# Patient Record
Sex: Female | Born: 1995 | Race: White | Hispanic: No | Marital: Single
Health system: Southern US, Community
[De-identification: ages and names within clinical notes are randomized; demographics above are authoritative.]

## PROBLEM LIST (undated history)

## (undated) DIAGNOSIS — F329 Major depressive disorder, single episode, unspecified: Secondary | ICD-10-CM

## (undated) DIAGNOSIS — K219 Gastro-esophageal reflux disease without esophagitis: Secondary | ICD-10-CM

## (undated) DIAGNOSIS — F419 Anxiety disorder, unspecified: Secondary | ICD-10-CM

## (undated) DIAGNOSIS — I10 Essential (primary) hypertension: Secondary | ICD-10-CM

## (undated) DIAGNOSIS — J020 Streptococcal pharyngitis: Secondary | ICD-10-CM

## (undated) DIAGNOSIS — D649 Anemia, unspecified: Secondary | ICD-10-CM

## (undated) DIAGNOSIS — E349 Endocrine disorder, unspecified: Secondary | ICD-10-CM

## (undated) DIAGNOSIS — F32A Depression, unspecified: Secondary | ICD-10-CM

## (undated) HISTORY — DX: Depression, unspecified: F32.A

## (undated) HISTORY — DX: Endocrine disorder, unspecified: E34.9

## (undated) HISTORY — DX: Anxiety disorder, unspecified: F41.9

## (undated) HISTORY — DX: Major depressive disorder, single episode, unspecified: F32.9

## (undated) HISTORY — DX: Anemia, unspecified: D64.9

---
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
## 2016-01-02 ENCOUNTER — Encounter: Payer: Self-pay | Admitting: Obstetrics & Gynecology

## 2016-03-01 ENCOUNTER — Encounter: Payer: Self-pay | Admitting: Obstetrics & Gynecology

## 2016-03-05 IMAGING — CR DG CHEST 2V
2 series · 2 of 2 positions shown · non-contrast
Comparison: 02/09/2011

CLINICAL DATA: Shortness of breath, cough.

EXAM:
CHEST  2 VIEW

[w chest pa]
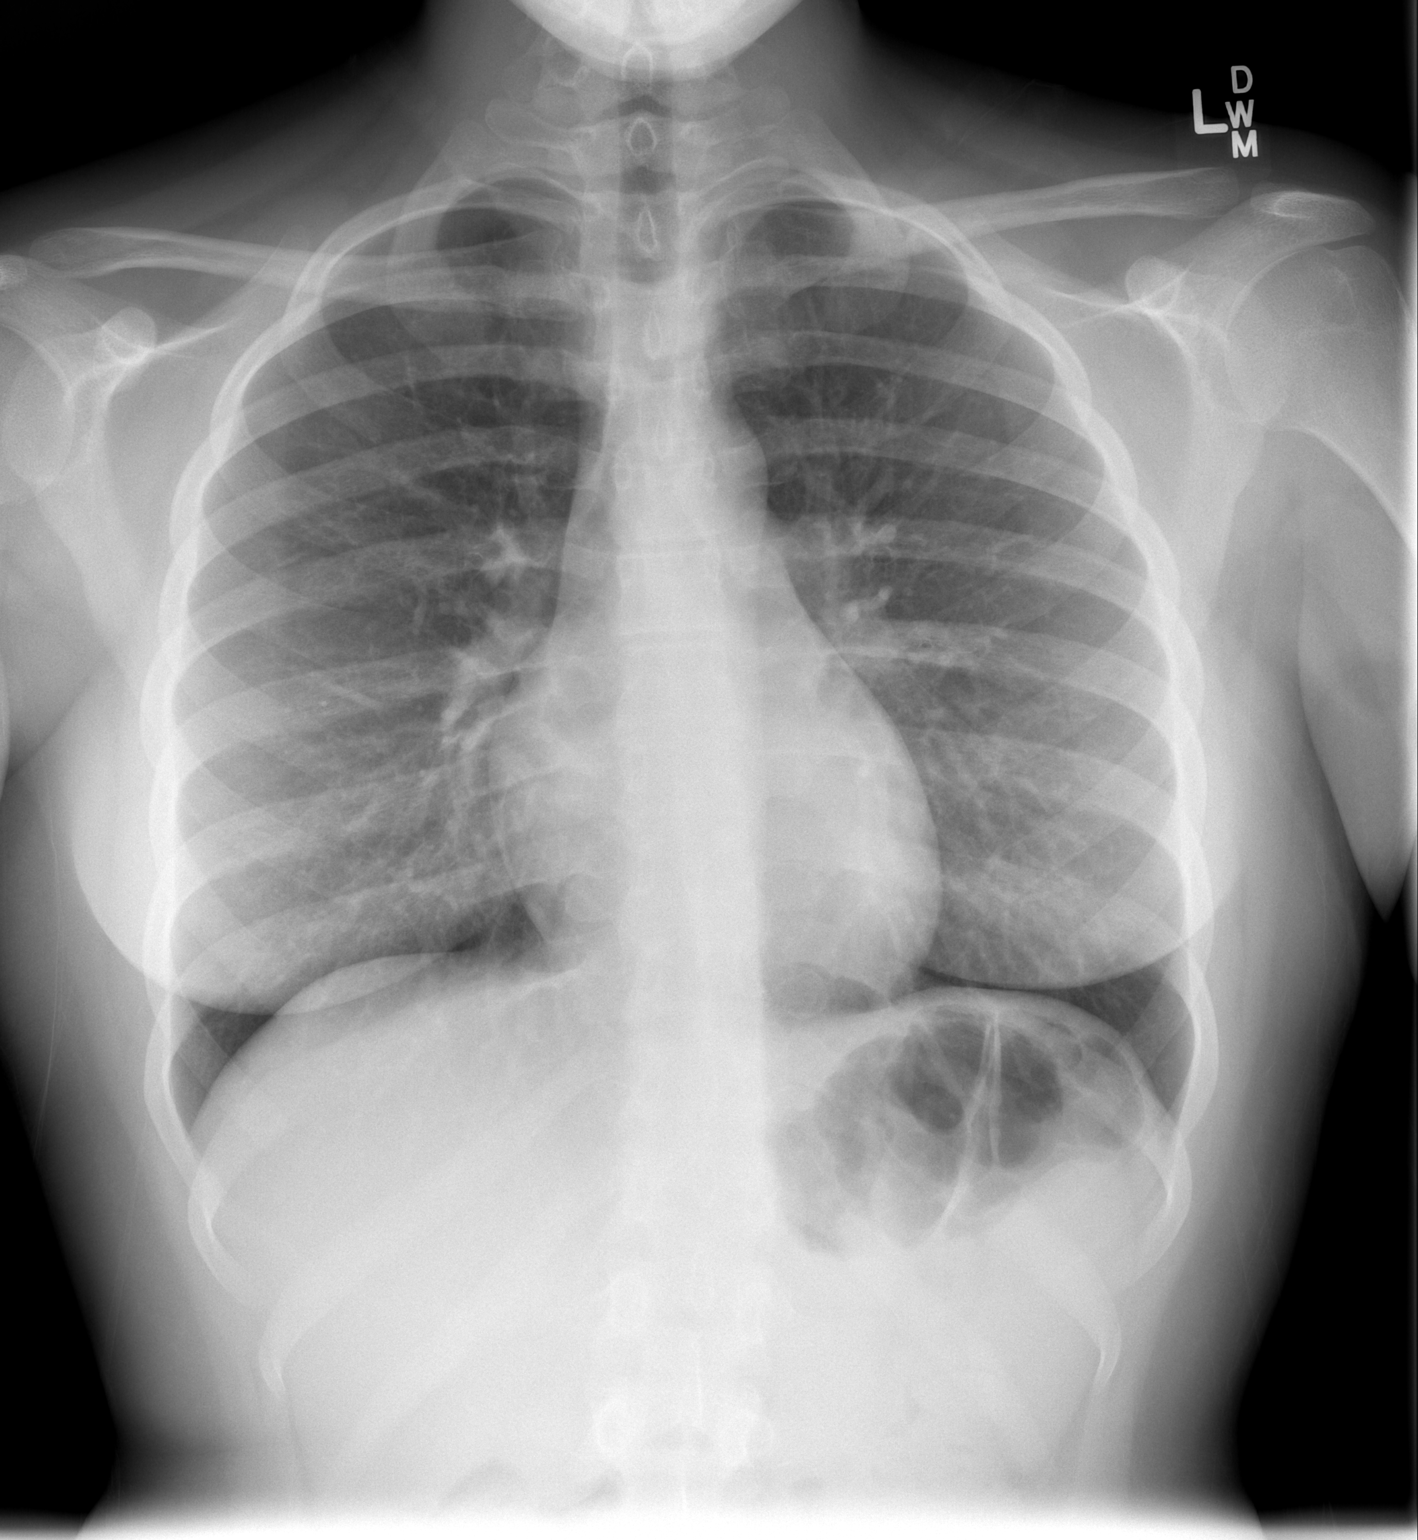

[w chest lat]
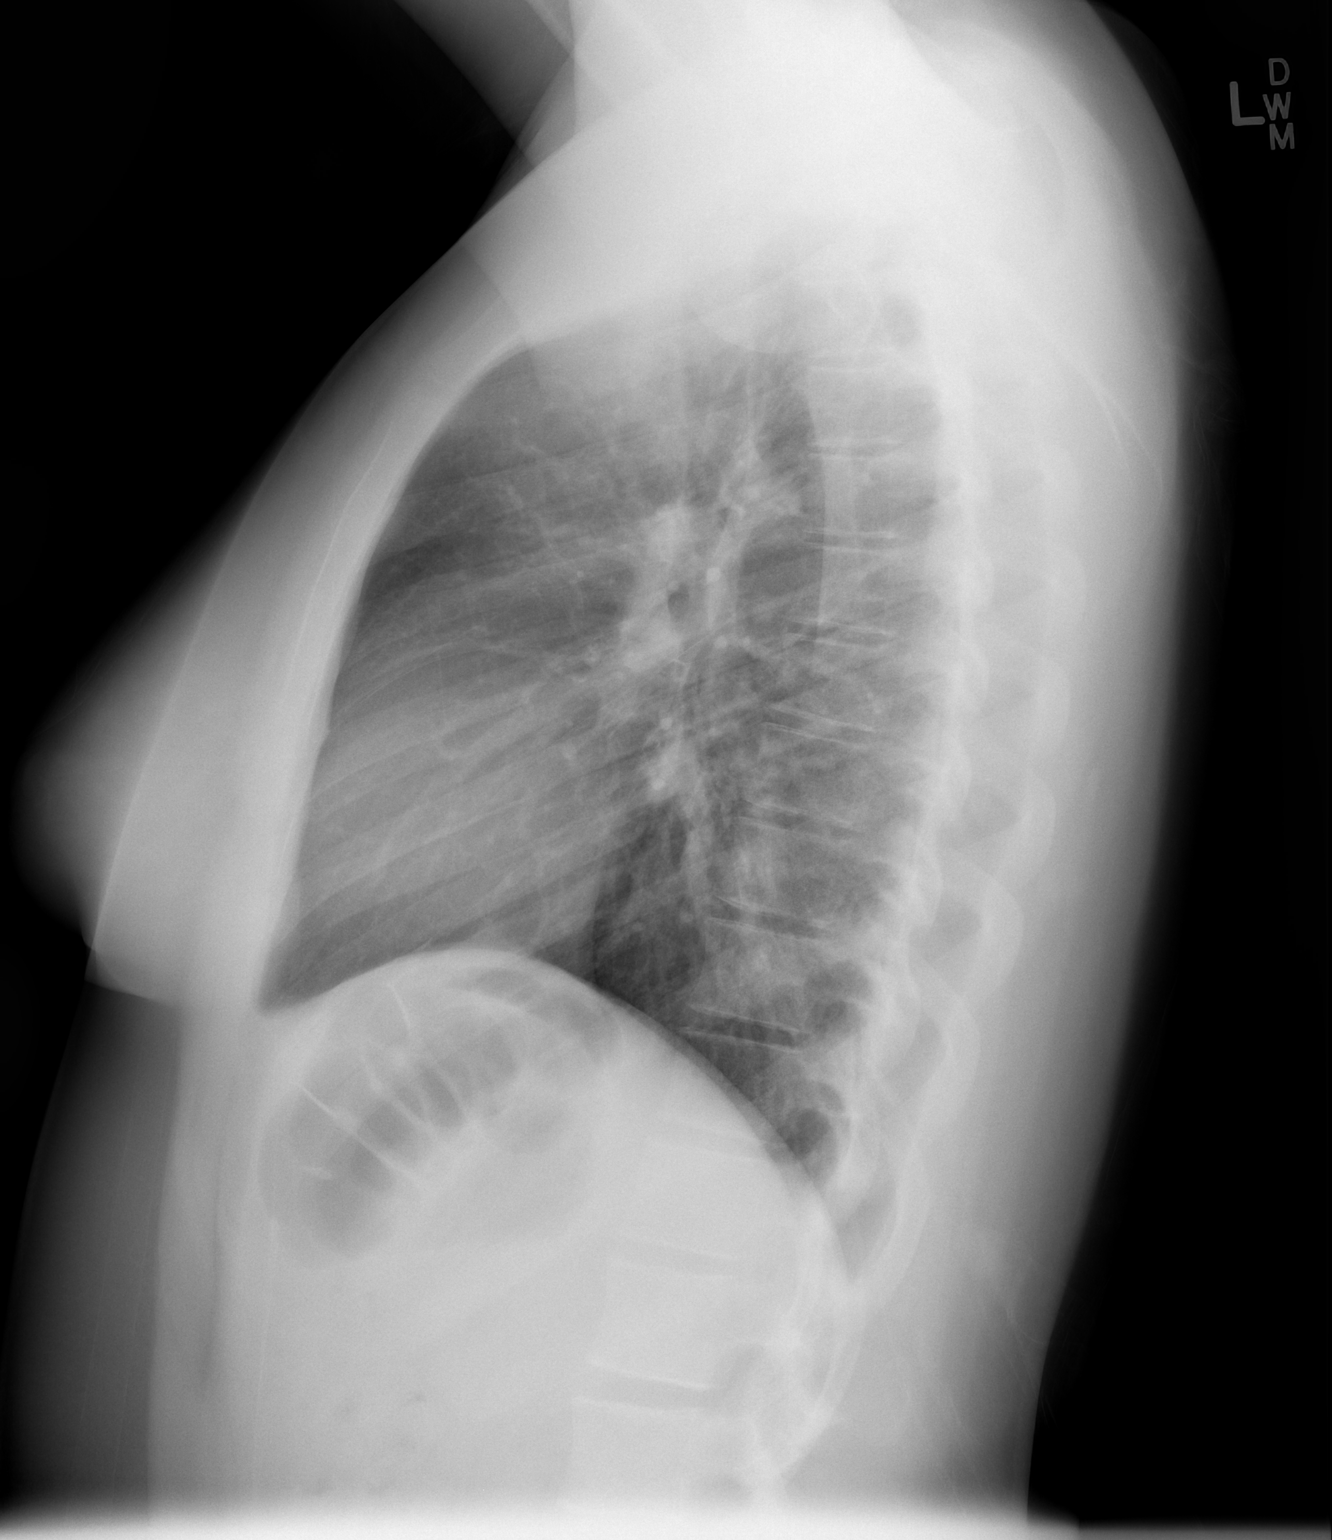

[2 of 2 positions shown; findings below may reference images not displayed]

FINDINGS: The heart size and mediastinal contours are within normal limits.
Both lungs are clear. The visualized skeletal structures are
unremarkable.
IMPRESSION: No active cardiopulmonary disease.

## 2016-04-07 ENCOUNTER — Encounter: Payer: Self-pay | Admitting: Obstetrics & Gynecology

## 2016-08-20 IMAGING — CT CT RENAL STONE PROTOCOL
2 of 4 series · 16 of 46 positions shown, 18 images · non-contrast
Comparison: 10/09/2013 ultrasound.

CLINICAL DATA: Abdominal pain for 3 weeks. LEFT upper quadrant
pain. Pain has migrated from the lower abdomen to the LEFT upper
quadrant. LEFT flank pain.

EXAM:
CT ABDOMEN AND PELVIS WITHOUT CONTRAST
TECHNIQUE: Multidetector CT imaging of the abdomen and pelvis was performed
following the standard protocol without IV contrast.

[Series 2: renal stone < 200 lbs 5.0 b31f · axial · 0.65mm/px · z∈[-480,-75]mm · 13 of 89 slices shown, 15 images]
[im 4/89  soft-tissue]
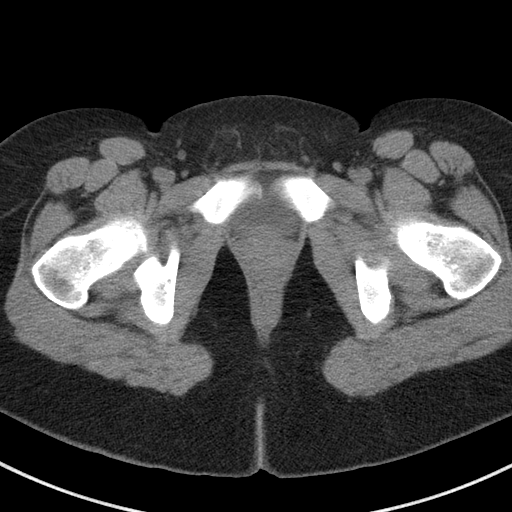
[im 4/89  bone]
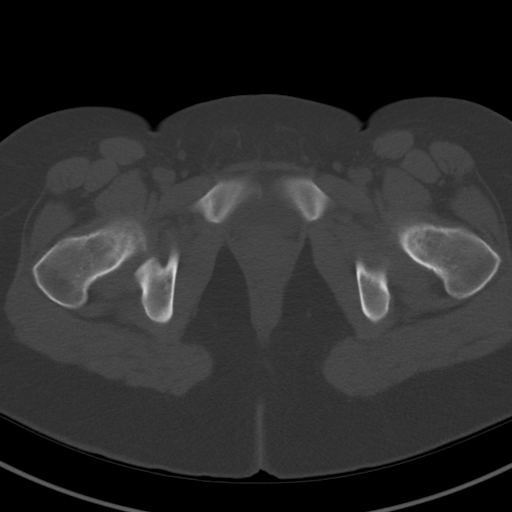
[im 12/89  soft-tissue]
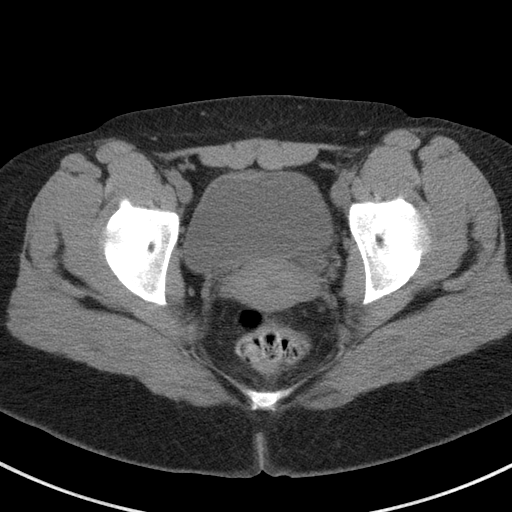
[im 19/89  soft-tissue]
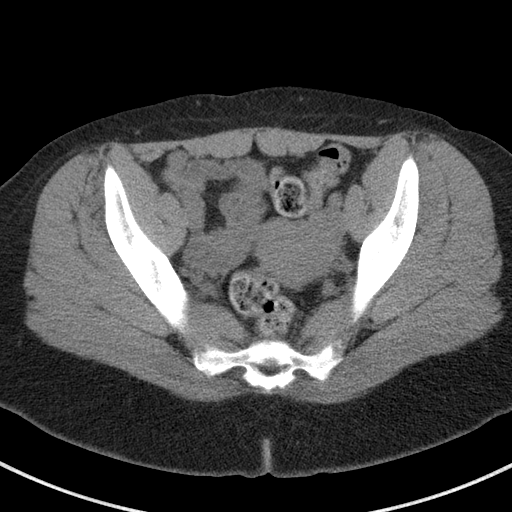
[im 26/89  soft-tissue]
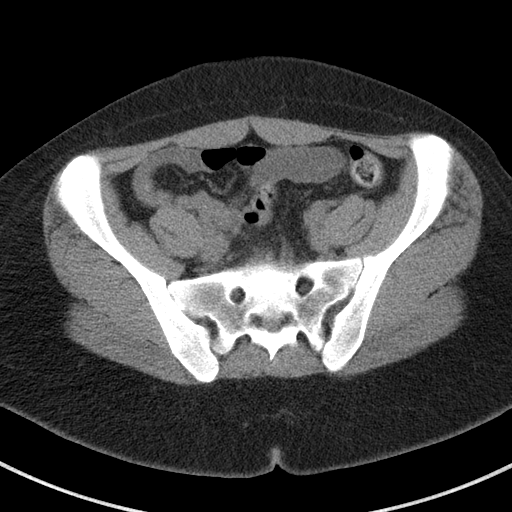
[im 30/89  soft-tissue]
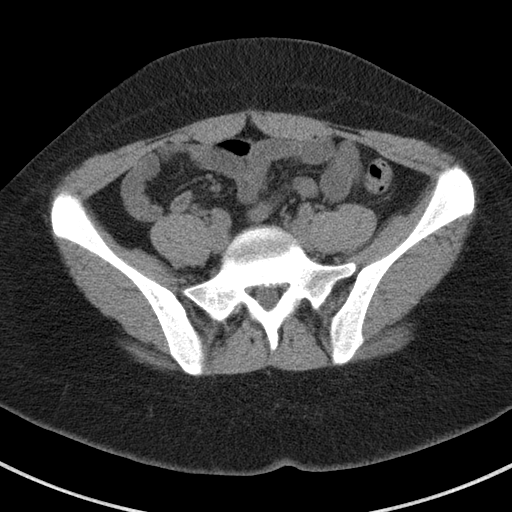
[im 37/89  soft-tissue]
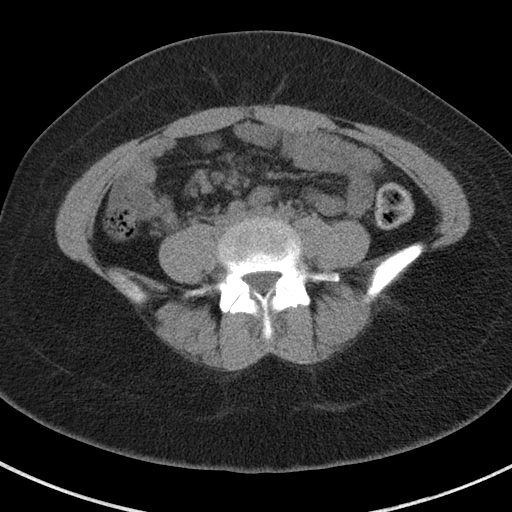
[im 45/89  soft-tissue]
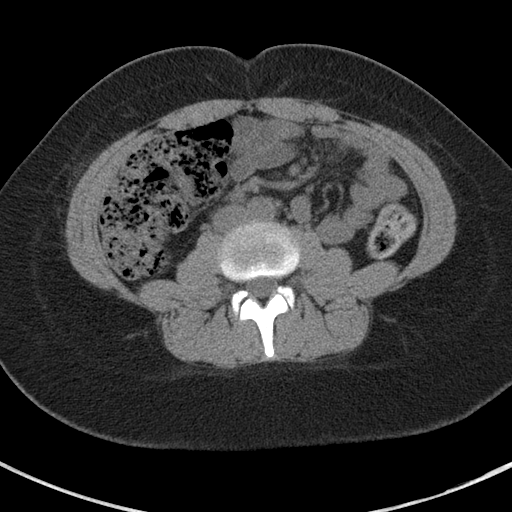
[im 52/89  soft-tissue]
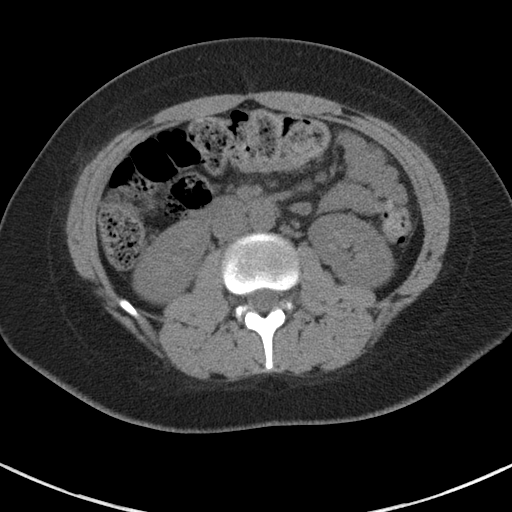
[im 59/89  soft-tissue]
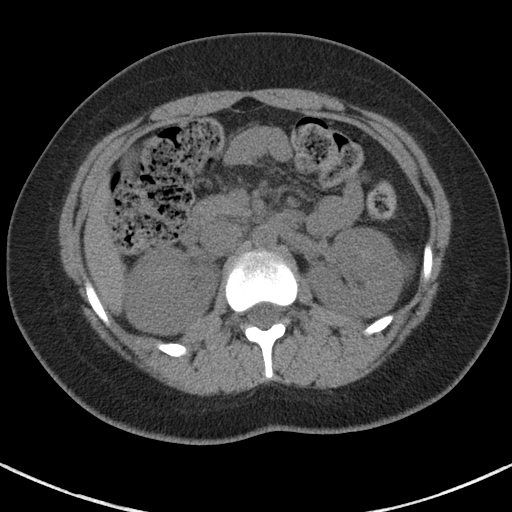
[im 59/89  bone]
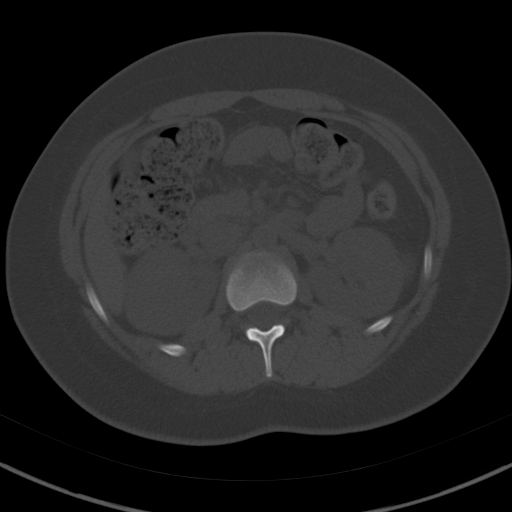
[im 63/89  soft-tissue]
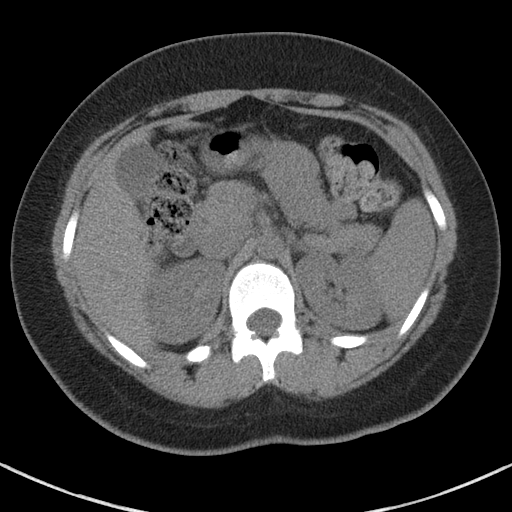
[im 70/89  soft-tissue]
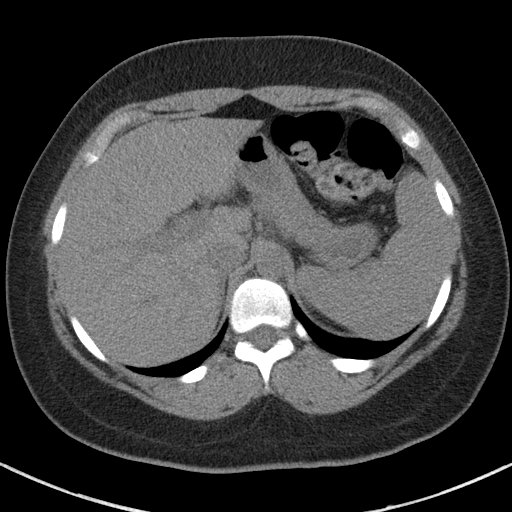
[im 78/89  soft-tissue]
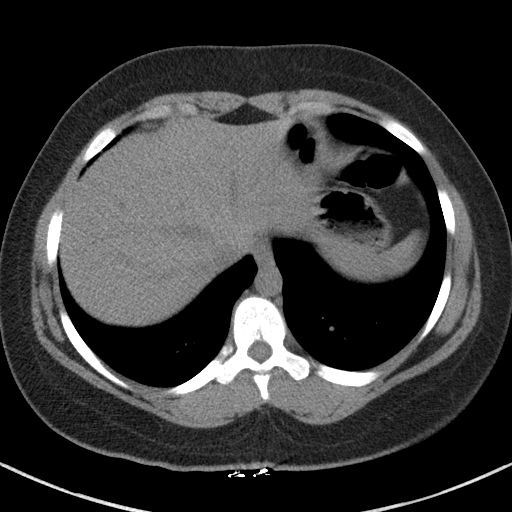
[im 85/89  soft-tissue]
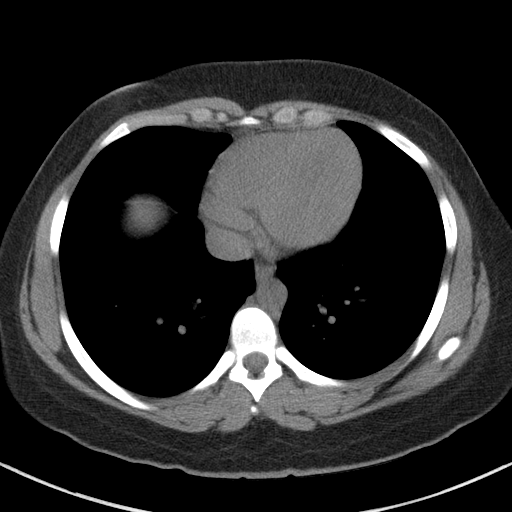

[Series 5: renal stone 3.0 coronal · coronal · 0.60mm/px · 3 of 91 slices shown]
[im 31/91  soft-tissue]
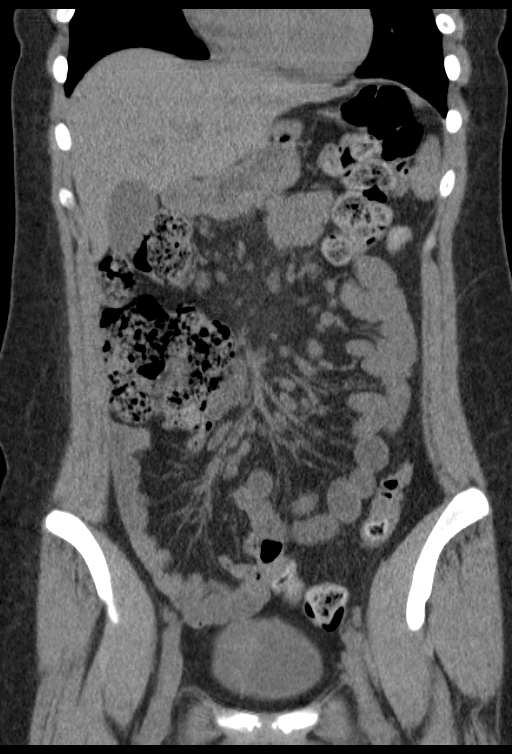
[im 41/91  soft-tissue]
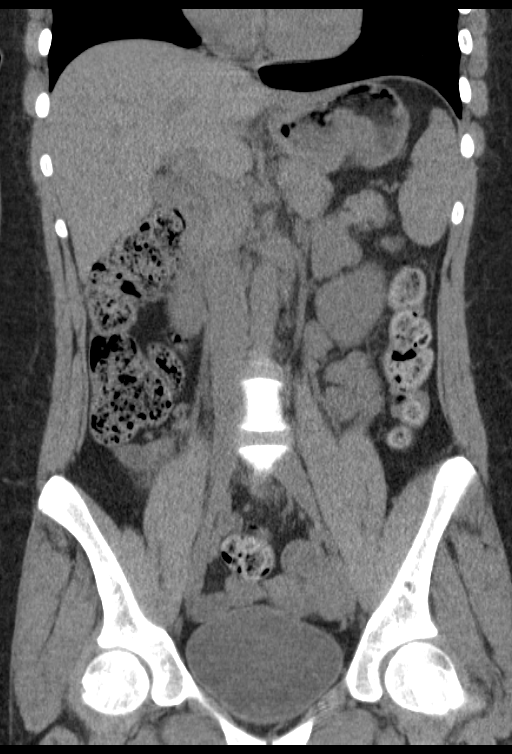
[im 51/91  soft-tissue]
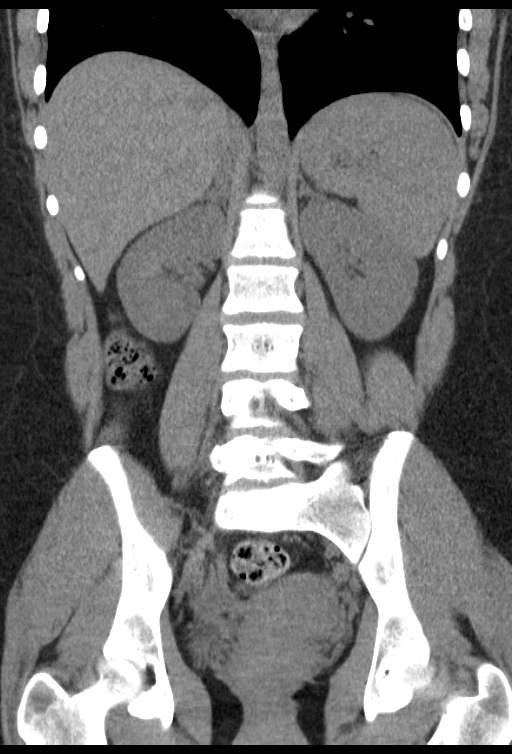

[16 of 46 positions shown; findings below may reference images not displayed]

FINDINGS: Musculoskeletal:  Normal.

Lung Bases: Normal.

Liver: Unenhanced CT was performed per clinician order. Lack of IV
contrast limits sensitivity and specificity, especially for
evaluation of abdominal/pelvic solid viscera. Normal.

Spleen:  Normal.

Gallbladder:  No calcified stones.  Normal.

Common bile duct:  Normal.

Pancreas:  Normal.

Adrenal glands:  Normal.

Kidneys: No renal calculi are hydronephrosis. No perinephric
stranding. Both ureters appear within normal limits.

Stomach:  Grossly normal allowing for noncontrast technique.

Small bowel: Small bowel appears within normal limits for a
noncontrast study. There are prominent mesenteric lymph nodes which
are nonspecific. These are best seen on the coronal reconstructed
images. A few of these are borderline in size.

Colon: Normal appendix. Large RIGHT-sided stool burden. Stool fills
the colon.

Pelvic Genitourinary: Normal appearance of the urinary bladder.
Uterus and adnexa appear within normal limits for age.

Peritoneum: No free air or free fluid.

Vasculature: Normal.

Body Wall: Normal.
IMPRESSION: No acute abnormality. Negative for urolithiasis. Prominent
mesenteric lymph nodes can be seen with mesenteric adenitis but are
nonspecific. Large stool burden.

## 2018-02-27 LAB — OB RESULTS CONSOLE RUBELLA ANTIBODY, IGM: Rubella: IMMUNE

## 2018-02-27 LAB — OB RESULTS CONSOLE RPR: RPR: NONREACTIVE

## 2018-02-27 LAB — OB RESULTS CONSOLE HIV ANTIBODY (ROUTINE TESTING): HIV: NONREACTIVE

## 2018-02-27 LAB — OB RESULTS CONSOLE HEPATITIS B SURFACE ANTIGEN: Hepatitis B Surface Ag: NEGATIVE

## 2018-02-27 LAB — OB RESULTS CONSOLE GC/CHLAMYDIA
Chlamydia: NEGATIVE
Gonorrhea: NEGATIVE

## 2018-03-05 NOTE — L&D Delivery Note (Signed)
Delivery Note At 4:19 PM a viable and healthy female was delivered via  (Presentation: left occiput; anterior ).  APGAR: 9, 9; weight pending .   Placenta status: spontaneous, intact.  Cord: 3V    Anesthesia:  Epidural Episiotomy: None  Lacerations:  Second degree perineal Suture Repair: 3.0 vicryl Est. Blood Loss (mL):    Mom to postpartum.  Baby to Couplet care / Skin to Skin.  [name] 10/03/2018, 4:44 PM

## 2018-08-01 ENCOUNTER — Encounter (HOSPITAL_COMMUNITY): Payer: Self-pay

## 2018-08-01 ENCOUNTER — Other Ambulatory Visit: Payer: Self-pay

## 2018-08-01 ENCOUNTER — Inpatient Hospital Stay (HOSPITAL_COMMUNITY)
Admission: AD | Admit: 2018-08-01 | Discharge: 2018-08-01 | Disposition: A | Discharge status: Home / Self Care | Payer: Managed Care, Other (non HMO) | Attending: Obstetrics | Admitting: Obstetrics

## 2018-08-01 DIAGNOSIS — O163 Unspecified maternal hypertension, third trimester: Secondary | ICD-10-CM | POA: Insufficient documentation

## 2018-08-01 DIAGNOSIS — R42 Dizziness and giddiness: Secondary | ICD-10-CM | POA: Diagnosis not present

## 2018-08-01 DIAGNOSIS — O10919 Unspecified pre-existing hypertension complicating pregnancy, unspecified trimester: Secondary | ICD-10-CM

## 2018-08-01 DIAGNOSIS — R519 Headache, unspecified: Secondary | ICD-10-CM

## 2018-08-01 DIAGNOSIS — Z3A30 30 weeks gestation of pregnancy: Secondary | ICD-10-CM | POA: Diagnosis not present

## 2018-08-01 DIAGNOSIS — D649 Anemia, unspecified: Secondary | ICD-10-CM | POA: Insufficient documentation

## 2018-08-01 DIAGNOSIS — O368130 Decreased fetal movements, third trimester, not applicable or unspecified: Secondary | ICD-10-CM | POA: Diagnosis present

## 2018-08-01 DIAGNOSIS — Z885 Allergy status to narcotic agent status: Secondary | ICD-10-CM | POA: Insufficient documentation

## 2018-08-01 DIAGNOSIS — Z7722 Contact with and (suspected) exposure to environmental tobacco smoke (acute) (chronic): Secondary | ICD-10-CM | POA: Insufficient documentation

## 2018-08-01 DIAGNOSIS — R51 Headache: Secondary | ICD-10-CM | POA: Diagnosis not present

## 2018-08-01 DIAGNOSIS — O99013 Anemia complicating pregnancy, third trimester: Secondary | ICD-10-CM | POA: Diagnosis not present

## 2018-08-01 DIAGNOSIS — O26893 Other specified pregnancy related conditions, third trimester: Secondary | ICD-10-CM | POA: Insufficient documentation

## 2018-08-01 DIAGNOSIS — Z3689 Encounter for other specified antenatal screening: Secondary | ICD-10-CM

## 2018-08-01 LAB — PROTEIN / CREATININE RATIO, URINE
Creatinine, Urine: 72.31 mg/dL
Protein Creatinine Ratio: 0.11 mg/mg{Cre} (ref 0.00–0.15)
Total Protein, Urine: 8 mg/dL

## 2018-08-01 LAB — COMPREHENSIVE METABOLIC PANEL
ALT: 17 U/L (ref 0–44)
AST: 17 U/L (ref 15–41)
Albumin: 3.1 g/dL — ABNORMAL LOW (ref 3.5–5.0)
Alkaline Phosphatase: 80 U/L (ref 38–126)
Anion gap: 9 (ref 5–15)
BUN: 5 mg/dL — ABNORMAL LOW (ref 6–20)
CO2: 20 mmol/L — ABNORMAL LOW (ref 22–32)
Calcium: 9 mg/dL (ref 8.9–10.3)
Chloride: 108 mmol/L (ref 98–111)
Creatinine, Ser: 0.5 mg/dL (ref 0.44–1.00)
GFR calc Af Amer: >60 mL/min (ref >60)
GFR calc non Af Amer: >60 mL/min (ref >60)
Glucose, Bld: 90 mg/dL (ref 70–99)
Potassium: 3.4 mmol/L — ABNORMAL LOW (ref 3.5–5.1)
Sodium: 137 mmol/L (ref 135–145)
Total Bilirubin: 0.4 mg/dL (ref 0.3–1.2)
Total Protein: 6.4 g/dL — ABNORMAL LOW (ref 6.5–8.1)

## 2018-08-01 LAB — URINALYSIS, ROUTINE W REFLEX MICROSCOPIC
Bilirubin Urine: NEGATIVE
Glucose, UA: NEGATIVE mg/dL
Hgb urine dipstick: NEGATIVE
Ketones, ur: NEGATIVE mg/dL
Leukocytes,Ua: NEGATIVE
Nitrite: NEGATIVE
Protein, ur: NEGATIVE mg/dL
Specific Gravity, Urine: 1.009 (ref 1.005–1.030)
pH: 7 (ref 5.0–8.0)

## 2018-08-01 LAB — CBC
HCT: 27.7 % — ABNORMAL LOW (ref 36.0–46.0)
Hemoglobin: 8.1 g/dL — ABNORMAL LOW (ref 12.0–15.0)
MCH: 21.4 pg — ABNORMAL LOW (ref 26.0–34.0)
MCHC: 29.2 g/dL — ABNORMAL LOW (ref 30.0–36.0)
MCV: 73.3 fL — ABNORMAL LOW (ref 80.0–100.0)
Platelets: 219 10*3/uL (ref 150–400)
RBC: 3.78 MIL/uL — ABNORMAL LOW (ref 3.87–5.11)
RDW: 16.2 % — ABNORMAL HIGH (ref 11.5–15.5)
WBC: 11.1 10*3/uL — ABNORMAL HIGH (ref 4.0–10.5)
nRBC: 0.2 % (ref 0.0–0.2)

## 2018-08-01 MED ORDER — ACETAMINOPHEN 500 MG PO TABS
1000.0000 mg | ORAL_TABLET | Freq: Once | ORAL | Status: AC
  Administered 2018-08-01: 1000 mg via ORAL
  Filled 2018-08-01: qty 2

## 2018-08-01 NOTE — MAU Note (Signed)
Pt stated she was out today and felt dizzy and lightheaded with a headache. like she was going to pass out. Feels better now after resting and eating and drinking. Concerned now that she has not felt baby move much today. Still c/o headache also.

## 2018-08-01 NOTE — MAU Provider Note (Addendum)
History     CSN: [id number]  Arrival date and time: 08/01/18 1700   First Provider Initiated Contact with Patient 08/01/18 1820      Chief Complaint  Patient presents with  . Decreased Fetal Movement  . Dizziness   Ms. Pam Pace is a 23 y.o. G1P0000 at [redacted]w[redacted]d who presents to MAU for feeling decreased fetal movement beginning 1500. Of note, pt reports upon provider entering the room that fetal movement has returned to normal.  Last food/drink: 1400, nothing since Smoker? no Current medications/supplements: Fe, PNVs, baby ASA Recent AFI: not on file Anterior placenta? yes Doing FKCs? no Problems this pregnancy include: cHTN, anemia Pt denies prior instances of DFM. Risk factors for still birth include: cHTN  Pt reports HA today, rates 4/10. Pt has not taken anything today for HA. Pt reports frequent, non-migraine HA prior to pregnancy. Pt reports she did not take anything for HA prior to pregnancy.  Pt denies blurry vision/seeing spots, N/V, epigastric pain, swelling in face and hands, sudden weight gain. Pt denies chest pain and SOB.  Pt denies VB, LOF, ctx, vaginal discharge/odor/itching.  Allergies? Codeine, hydrocodone Prenatal care provider/next appt? [facility], next appt 08/14/2018   OB History    Gravida  1   Para  0   Term  0   Preterm  0   AB  0   Living  0     SAB  0   TAB  0   Ectopic  0   Multiple  0   Live Births              Past Medical History:  Diagnosis Date  . Anemia   . Anxiety   . Depression   . GERD (gastroesophageal reflux disease)   . Hormone disorder   . Hypertension   . Strep throat     History reviewed. No pertinent surgical history.  Family History  Problem Relation Age of Onset  . Thyroid disease Mother 12       Hypothyroidism   . Diabetes Maternal Grandmother   . Diabetes Paternal Grandmother     Social History   Tobacco Use  . Smoking status: Passive Smoke Exposure - Never Smoker  .  Smokeless tobacco: Never Used  Substance Use Topics  . Alcohol use: No  . Drug use: No    Allergies:  Allergies  Allergen Reactions  . Codeine Hives  . Hydrocodone Hives and Itching    Allergic to the cough syrup    Medications Prior to Admission  Medication Sig Dispense Refill Last Dose  . OVER THE COUNTER MEDICATION Iron daily   Past Month at Unknown time  . JUNEL FE 1/20 1-20 MG-MCG tablet Take 1 tablet by mouth daily. 3 Package 4     Review of Systems  Constitutional: Negative for chills, diaphoresis, fatigue and fever.  Respiratory: Negative for shortness of breath.   Cardiovascular: Negative for chest pain.  Gastrointestinal: Negative for abdominal pain, constipation, diarrhea, nausea and vomiting.  Genitourinary: Negative for dysuria, flank pain, frequency, pelvic pain, vaginal bleeding and vaginal discharge.  Neurological: Positive for headaches. Negative for dizziness, weakness and light-headedness.   Physical Exam   Blood pressure 134/77, pulse 97, temperature 98.6 F (37 C), resp. rate 18, height  (1.727 m), weight 106.6 kg, SpO2 100 %.  Patient Vitals for the past 24 hrs:  BP Temp Pulse Resp SpO2 Height Weight  08/01/18 2015 134/77 - 97 - 100 % - -  08/01/18 2000 133/80 - 95 - 100 % - -  08/01/18 1945 122/74 - 94 - 100 % - -  08/01/18 1930 124/71 - 93 - 100 % - -  08/01/18 1915 127/71 - 93 - 100 % - -  08/01/18 1900 132/87 - 98 - 100 % - -  08/01/18 1855 - - - - 99 % - -  08/01/18 1850 - - - - 100 % - -  08/01/18 1846 (!) 146/82 - (!) 101 - - - -  08/01/18 1820 - - - - 99 % - -  08/01/18 1816 134/74 - (!) 110 - - - -  08/01/18 1815 - - - - 98 % - -  08/01/18 1810 - - - - 99 % - -  08/01/18 1805 - - - - 99 % - -  08/01/18 1801 (!) 145/75 - (!) 113 - - - -  08/01/18 1800 - - - - 98 % - -  08/01/18 1753 (!) 150/84 - (!) 114 - - - -  08/01/18 1752 (!) 148/83 - (!) 114 - - - -  08/01/18 1726 140/85 98.6 F (37 C) (!) 125 18 - 5\' 8"  (1.727 m) 106.6 kg    Physical Exam  Constitutional: She is oriented to person, place, and time. She appears well-developed and well-nourished. No distress.  HENT:  Head: Normocephalic and atraumatic.  Respiratory: Effort normal.  GI: Soft. She exhibits no distension and no mass. There is no abdominal tenderness. There is no rebound and no guarding.  Neurological: She is alert and oriented to person, place, and time.  Skin: Skin is warm and dry. She is not diaphoretic.  Psychiatric: She has a normal mood and affect. Her behavior is normal. Judgment and thought content normal.   Results for orders placed or performed during the hospital encounter of 08/01/18 (from the past 24 hour(s))  Urinalysis, Routine w reflex microscopic     Status: Abnormal   Collection Time: 08/01/18  5:48 PM  Result Value Ref Range   Color, Urine YELLOW YELLOW   APPearance HAZY (A) CLEAR   Specific Gravity, Urine 1.009 1.005 - 1.030   pH 7.0 5.0 - 8.0   Glucose, UA NEGATIVE NEGATIVE mg/dL   Hgb urine dipstick NEGATIVE NEGATIVE   Bilirubin Urine NEGATIVE NEGATIVE   Ketones, ur NEGATIVE NEGATIVE mg/dL   Protein, ur NEGATIVE NEGATIVE mg/dL   Nitrite NEGATIVE NEGATIVE   Leukocytes,Ua NEGATIVE NEGATIVE  Protein / creatinine ratio, urine     Status: None   Collection Time: 08/01/18  6:01 PM  Result Value Ref Range   Creatinine, Urine 72.31 mg/dL   Total Protein, Urine 8 mg/dL   Protein Creatinine Ratio 0.11 0.00 - 0.15 mg/mg[Cre]  CBC     Status: Abnormal   Collection Time: 08/01/18  6:16 PM  Result Value Ref Range   WBC 11.1 (H) 4.0 - 10.5 K/uL   RBC 3.78 (L) 3.87 - 5.11 MIL/uL   Hemoglobin 8.1 (L) 12.0 - 15.0 g/dL   HCT 40.927.7 (L) 81.136.0 - 91.446.0 %   MCV 73.3 (L) 80.0 - 100.0 fL   MCH 21.4 (L) 26.0 - 34.0 pg   MCHC 29.2 (L) 30.0 - 36.0 g/dL   RDW 78.216.2 (H) 95.611.5 - 21.315.5 %   Platelets 219 150 - 400 K/uL   nRBC 0.2 0.0 - 0.2 %  Comprehensive metabolic panel     Status: Abnormal   Collection Time: 08/01/18  6:16 PM  Result Value  Ref  Range   Sodium 137 135 - 145 mmol/L   Potassium 3.4 (L) 3.5 - 5.1 mmol/L   Chloride 108 98 - 111 mmol/L   CO2 20 (L) 22 - 32 mmol/L   Glucose, Bld 90 70 - 99 mg/dL   BUN 5 (L) 6 - 20 mg/dL   Creatinine, Ser 3.54 0.44 - 1.00 mg/dL   Calcium 9.0 8.9 - 65.6 mg/dL   Total Protein 6.4 (L) 6.5 - 8.1 g/dL   Albumin 3.1 (L) 3.5 - 5.0 g/dL   AST 17 15 - 41 U/L   ALT 17 0 - 44 U/L   Alkaline Phosphatase 80 38 - 126 U/L   Total Bilirubin 0.4 0.3 - 1.2 mg/dL   GFR calc non Af Amer >60 >60 mL/min   GFR calc Af Amer >60 >60 mL/min   Anion gap 9 5 - 15   No results found.  MAU Course  Procedures  MDM -DFM - pt reports fetal movement returned to normal upon provider entering room -EFM: reactive with variables       -baseline: 140 (later 135)       -variability: moderate       -accels: present, 10x10       -decels: irregular variables       -TOCO: no ctx  -preeclampsia evaluation -HA 4/10, Tyelnol 1000mg  given, pt reports HA 2/10 -UA: hazy, otherwise WNL -CBC: H/H 8.1/27.7, platelets 219 - pt reports she was diagnosed with anemia at her 28wk visit with a hgb of 7 and was prescribed oral iron -CMP: no abnormalities requiring treatment, AST/ALT 17/17 -PCr: 0.11  -spoke with Dr. Earlene Plater @1950  re: pt's H/H and iron infusion, per Dr. Earlene Plater, pt to discuss IV iron with OB provider, do not need to infuse in MAU or set up outpatient infusion -discussed possibility of IV infusion of iron with pt, will defer to OB/GYN for decision based on shared-decision making with patient - cont IV iron  -pt discharged to home in stable condition  Orders Placed This Encounter  Procedures  . Urinalysis, Routine w reflex microscopic    Standing Status:   Standing    Number of Occurrences:   1  . CBC    Standing Status:   Standing    Number of Occurrences:   1  . Comprehensive metabolic panel    Standing Status:   Standing    Number of Occurrences:   1  . Protein / creatinine ratio, urine    Standing  Status:   Standing    Number of Occurrences:   1  . Discharge patient    Order Specific Question:   Discharge disposition    Answer:   01-Home or Self Care [1]    Order Specific Question:   Discharge patient date    Answer:   08/01/2018   Meds ordered this encounter  Medications  . acetaminophen (TYLENOL) tablet 1,000 mg   Assessment and Plan   1. Decreased fetal movements in third trimester, single or unspecified fetus   2. [redacted] weeks gestation of pregnancy   3. NST (non-stress test) reactive   4. Chronic hypertension affecting pregnancy   5. Pregnancy headache in third trimester    Allergies as of 08/01/2018      Reactions   Codeine Hives   Hydrocodone Hives, Itching   Allergic to the cough syrup      Medication List    STOP taking these medications   Junel FE 1/20 1-20 MG-MCG tablet Generic  drug:  norethindrone-ethinyl estradiol     TAKE these medications   OVER THE COUNTER MEDICATION Iron daily      -discussed normal fetal movement in pregnancy -discussed FKCs and ways to elicit fetal movement at home -discussed appropriate nutrition and hydration in pregnancy -discussed link between anterior placental and sensation of fetal movement -pt advised to discuss IV infusion with OB provider in light of anemia -strict preeclampsia/return MAU precautions discussed -pt discharged to home in stable condition  Odie Sera Nugent 08/01/2018, 8:18 PM    Attestation of Attending Supervision of Advanced Practitioner (PA/CNM/NP): Evaluation and management procedures were performed by the Advanced Practitioner under my supervision and collaboration.  I have reviewed the Advanced Practitioner's note and chart, and I agree with the management and plan.  Baldemar Lenis, M.D. [facility]ending [facility] [employer] (Faculty Practice)  08/02/2018 12:23 AM

## 2018-08-01 NOTE — Discharge Instructions (Signed)
Preeclampsia and Eclampsia  Preeclampsia is a serious condition that may develop during pregnancy. It is also called toxemia of pregnancy. This condition causes high blood pressure along with other symptoms, such as swelling and headaches. These symptoms may develop as the condition gets worse. Preeclampsia may occur at 20 weeks of pregnancy or later. Diagnosing and treating preeclampsia early is very important. If not treated early, it can cause serious problems for you and your baby. One problem it can lead to is eclampsia. Eclampsia is a condition that causes muscle jerking or shaking (convulsions or seizures) and other serious problems for the mother. During pregnancy, delivering your baby may be the best treatment for preeclampsia or eclampsia. For most women, preeclampsia and eclampsia symptoms go away after giving birth. In rare cases, a woman may develop preeclampsia after giving birth (postpartum preeclampsia). This usually occurs within 48 hours after childbirth but may occur up to 6 weeks after giving birth. What are the causes? The cause of preeclampsia is not known. What increases the risk? The following risk factors make you more likely to develop preeclampsia:  Being pregnant for the first time.  Having had preeclampsia during a past pregnancy.  Having a family history of preeclampsia.  Having high blood pressure.  Being pregnant with more than one baby.  Being 35 or older.  Being African-American.  Having kidney disease or diabetes.  Having medical conditions such as lupus or blood diseases.  Being very overweight (obese). What are the signs or symptoms? The earliest signs of preeclampsia are:  High blood pressure.  Increased protein in your urine. Your health care provider will check for this at every visit before you give birth (prenatal visit). Other symptoms that may develop as the condition gets worse include:  Severe headaches.  Sudden weight gain.   Swelling of the hands, face, legs, and feet.  Nausea and vomiting.  Vision problems, such as blurred or double vision.  Numbness in the face, arms, legs, and feet.  Urinating less than usual.  Dizziness.  Slurred speech.  Abdominal pain, especially upper abdominal pain.  Convulsions or seizures. How is this diagnosed? There are no screening tests for preeclampsia. Your health care provider will ask you about symptoms and check for signs of preeclampsia during your prenatal visits. You may also have tests that include:  Urine tests.  Blood tests.  Checking your blood pressure.  Monitoring your baby's heart rate.  Ultrasound. How is this treated? You and your health care provider will determine the treatment approach that is best for you. Treatment may include:  Having more frequent prenatal exams to check for signs of preeclampsia, if you have an increased risk for preeclampsia.  Medicine to lower your blood pressure.  Staying in the hospital, if your condition is severe. There, treatment will focus on controlling your blood pressure and the amount of fluids in your body (fluid retention).  Taking medicine (magnesium sulfate) to prevent seizures. This may be given as an injection or through an IV.  Taking a low-dose aspirin during your pregnancy.  Delivering your baby early, if your condition gets worse. You may have your labor started with medicine (induced), or you may have a cesarean delivery. Follow these instructions at home: Eating and drinking   Drink enough fluid to keep your urine pale yellow.  Avoid caffeine. Lifestyle  Do not use any products that contain nicotine or tobacco, such as cigarettes and e-cigarettes. If you need help quitting, ask your health care provider.    Do not use alcohol or drugs.  Avoid stress as much as possible. Rest and get plenty of sleep. General instructions  Take over-the-counter and prescription medicines only as told by  your health care provider.  When lying down, lie on your left side. This keeps pressure off your major blood vessels.  When sitting or lying down, raise (elevate) your feet. Try putting some pillows underneath your lower legs.  Exercise regularly. Ask your health care provider what kinds of exercise are best for you.  Keep all follow-up and prenatal visits as told by your health care provider. This is important. How is this prevented? There is no known way of preventing preeclampsia or eclampsia from developing. However, to lower your risk of complications and detect problems early:  Get regular prenatal care. Your health care provider may be able to diagnose and treat the condition early.  Maintain a healthy weight. Ask your health care provider for help managing weight gain during pregnancy.  Work with your health care provider to manage any long-term (chronic) health conditions you have, such as diabetes or kidney problems.  You may have tests of your blood pressure and kidney function after giving birth.  Your health care provider may have you take low-dose aspirin during your next pregnancy. Contact a health care provider if:  You have symptoms that your health care provider told you may require more treatment or monitoring, such as: ? Headaches. ? Nausea or vomiting. ? Abdominal pain. ? Dizziness. ? Light-headedness. Get help right away if:  You have severe: ? Abdominal pain. ? Headaches that do not get better. ? Dizziness. ? Vision problems. ? Confusion. ? Nausea or vomiting.  You have any of the following: ? A seizure. ? Sudden, rapid weight gain. ? Sudden swelling in your hands, ankles, or face. ? Trouble moving any part of your body. ? Numbness in any part of your body. ? Trouble speaking. ? Abnormal bleeding.  You faint. Summary  Preeclampsia is a serious condition that may develop during pregnancy. It is also called toxemia of pregnancy.  This  condition causes high blood pressure along with other symptoms, such as swelling and headaches.  Diagnosing and treating preeclampsia early is very important. If not treated early, it can cause serious problems for you and your baby.  Get help right away if you have symptoms that your health care provider told you to watch for. This information is not intended to replace advice given to you by your health care provider. Make sure you discuss any questions you have with your health care provider. Document Released: 02/17/2000 Document Revised: 02/05/2017 Document Reviewed: 09/26/2015 Elsevier Interactive Patient Education  2019 [employer] Fetal Movement Counts Patient Name: ________________________________________________ Patient Due Date: ____________________ What is a fetal movement count?  A fetal movement count is the number of times that you feel your baby move during a certain amount of time. This may also be called a fetal kick count. A fetal movement count is recommended for every pregnant woman. You may be asked to start counting fetal movements as early as week 28 of your pregnancy. Pay attention to when your baby is most active. You may notice your baby's sleep and wake cycles. You may also notice things that make your baby move more. You should do a fetal movement count:  When your baby is normally most active.  At the same time each day. A good time to count movements is while you are resting, after having something to   eat and drink. How do I count fetal movements? 1. Find a quiet, comfortable area. Sit, or lie down on your side. 2. Write down the date, the start time and stop time, and the number of movements that you felt between those two times. Take this information with you to your health care visits. 3. For 2 hours, count kicks, flutters, swishes, rolls, and jabs. You should feel at least 10 movements during 2 hours. 4. You may stop counting after you have felt 10  movements. 5. If you do not feel 10 movements in 2 hours, have something to eat and drink. Then, keep resting and counting for 1 hour. If you feel at least 4 movements during that hour, you may stop counting. Contact a health care provider if:  You feel fewer than 4 movements in 2 hours.  Your baby is not moving like he or she usually does. Date: ____________ Start time: ____________ Stop time: ____________ Movements: ____________ Date: ____________ Start time: ____________ Stop time: ____________ Movements: ____________ Date: ____________ Start time: ____________ Stop time: ____________ Movements: ____________ Date: ____________ Start time: ____________ Stop time: ____________ Movements: ____________ Date: ____________ Start time: ____________ Stop time: ____________ Movements: ____________ Date: ____________ Start time: ____________ Stop time: ____________ Movements: ____________ Date: ____________ Start time: ____________ Stop time: ____________ Movements: ____________ Date: ____________ Start time: ____________ Stop time: ____________ Movements: ____________ Date: ____________ Start time: ____________ Stop time: ____________ Movements: ____________ This information is not intended to replace advice given to you by your health care provider. Make sure you discuss any questions you have with your health care provider. Document Released: 03/21/2006 Document Revised: 10/19/2015 Document Reviewed: 03/31/2015 Elsevier Interactive Patient Education  2019 Elsevier Inc.  

## 2018-09-19 LAB — OB RESULTS CONSOLE GBS: GBS: NEGATIVE

## 2018-10-02 ENCOUNTER — Other Ambulatory Visit: Payer: Self-pay | Admitting: Obstetrics

## 2018-10-03 ENCOUNTER — Inpatient Hospital Stay (HOSPITAL_COMMUNITY): Payer: Managed Care, Other (non HMO) | Admitting: Anesthesiology

## 2018-10-03 ENCOUNTER — Encounter (HOSPITAL_COMMUNITY): Payer: Self-pay

## 2018-10-03 ENCOUNTER — Inpatient Hospital Stay (HOSPITAL_COMMUNITY)
Admission: AD | Admit: 2018-10-03 | Discharge: 2018-10-05 | DRG: 807 | Disposition: A | Discharge status: Home / Self Care | Payer: Managed Care, Other (non HMO) | Attending: Obstetrics and Gynecology | Admitting: Obstetrics and Gynecology

## 2018-10-03 ENCOUNTER — Other Ambulatory Visit: Payer: Self-pay

## 2018-10-03 DIAGNOSIS — O1002 Pre-existing essential hypertension complicating childbirth: Secondary | ICD-10-CM | POA: Diagnosis present

## 2018-10-03 DIAGNOSIS — Z3A39 39 weeks gestation of pregnancy: Secondary | ICD-10-CM | POA: Diagnosis not present

## 2018-10-03 DIAGNOSIS — I10 Essential (primary) hypertension: Secondary | ICD-10-CM | POA: Diagnosis present

## 2018-10-03 DIAGNOSIS — Z20828 Contact with and (suspected) exposure to other viral communicable diseases: Secondary | ICD-10-CM | POA: Diagnosis present

## 2018-10-03 LAB — COMPREHENSIVE METABOLIC PANEL
ALT: 12 U/L (ref 0–44)
AST: 24 U/L (ref 15–41)
Albumin: 3.5 g/dL (ref 3.5–5.0)
Alkaline Phosphatase: 134 U/L — ABNORMAL HIGH (ref 38–126)
Anion gap: 12 (ref 5–15)
BUN: <5 mg/dL — ABNORMAL LOW (ref 6–20)
CO2: 19 mmol/L — ABNORMAL LOW (ref 22–32)
Calcium: 9.7 mg/dL (ref 8.9–10.3)
Chloride: 106 mmol/L (ref 98–111)
Creatinine, Ser: 0.54 mg/dL (ref 0.44–1.00)
GFR calc Af Amer: >60 mL/min (ref >60)
GFR calc non Af Amer: >60 mL/min (ref >60)
Glucose, Bld: 81 mg/dL (ref 70–99)
Potassium: 3.6 mmol/L (ref 3.5–5.1)
Sodium: 137 mmol/L (ref 135–145)
Total Bilirubin: 0.8 mg/dL (ref 0.3–1.2)
Total Protein: 6.3 g/dL — ABNORMAL LOW (ref 6.5–8.1)

## 2018-10-03 LAB — RPR: RPR Ser Ql: NONREACTIVE

## 2018-10-03 LAB — CBC WITH DIFFERENTIAL/PLATELET
Abs Immature Granulocytes: 0 10*3/uL (ref 0.00–0.07)
Basophils Absolute: 0 10*3/uL (ref 0.0–0.1)
Basophils Relative: 0 %
Eosinophils Absolute: 0 10*3/uL (ref 0.0–0.5)
Eosinophils Relative: 0 %
HCT: 35.6 % — ABNORMAL LOW (ref 36.0–46.0)
Hemoglobin: 11.2 g/dL — ABNORMAL LOW (ref 12.0–15.0)
Lymphocytes Relative: 4 %
Lymphs Abs: 0.6 10*3/uL — ABNORMAL LOW (ref 0.7–4.0)
MCH: 26.3 pg (ref 26.0–34.0)
MCHC: 31.5 g/dL (ref 30.0–36.0)
MCV: 83.6 fL (ref 80.0–100.0)
Monocytes Absolute: 1.3 10*3/uL — ABNORMAL HIGH (ref 0.1–1.0)
Monocytes Relative: 8 %
Neutro Abs: 14.2 10*3/uL — ABNORMAL HIGH (ref 1.7–7.7)
Neutrophils Relative %: 88 %
Platelets: 177 10*3/uL (ref 150–400)
RBC: 4.26 MIL/uL (ref 3.87–5.11)
WBC: 16.1 10*3/uL — ABNORMAL HIGH (ref 4.0–10.5)
nRBC: 0 % (ref 0.0–0.2)
nRBC: 0 /100 WBC

## 2018-10-03 LAB — CBC
HCT: 39.3 % (ref 36.0–46.0)
Hemoglobin: 12.5 g/dL (ref 12.0–15.0)
MCH: 26 pg (ref 26.0–34.0)
MCHC: 31.8 g/dL (ref 30.0–36.0)
MCV: 81.9 fL (ref 80.0–100.0)
Platelets: 185 10*3/uL (ref 150–400)
RBC: 4.8 MIL/uL (ref 3.87–5.11)
WBC: 9.4 10*3/uL (ref 4.0–10.5)
nRBC: 0 % (ref 0.0–0.2)

## 2018-10-03 LAB — ABO/RH: ABO/RH(D): O POS

## 2018-10-03 LAB — TYPE AND SCREEN
ABO/RH(D): O POS
Antibody Screen: NEGATIVE

## 2018-10-03 LAB — SARS CORONAVIRUS 2 BY RT PCR (HOSPITAL ORDER, PERFORMED IN ~~LOC~~ HOSPITAL LAB): SARS Coronavirus 2: NEGATIVE

## 2018-10-03 MED ORDER — LACTATED RINGERS IV SOLN
INTRAVENOUS | Status: DC
  Administered 2018-10-03: 01:00:00 via INTRAVENOUS

## 2018-10-03 MED ORDER — PHENYLEPHRINE 40 MCG/ML (10ML) SYRINGE FOR IV PUSH (FOR BLOOD PRESSURE SUPPORT): 80.0000 ug | PREFILLED_SYRINGE | INTRAVENOUS | Status: DC | PRN

## 2018-10-03 MED ORDER — LACTATED RINGERS IV SOLN
500.0000 mL | Freq: Once | INTRAVENOUS | Status: AC
  Administered 2018-10-03: 10:00:00 500 mL via INTRAVENOUS

## 2018-10-03 MED ORDER — TERBUTALINE SULFATE 1 MG/ML IJ SOLN: 0.2500 mg | Freq: Once | INTRAMUSCULAR | Status: DC | PRN

## 2018-10-03 MED ORDER — ONDANSETRON HCL 4 MG/2ML IJ SOLN: 4.0000 mg | INTRAMUSCULAR | Status: DC | PRN

## 2018-10-03 MED ORDER — TETANUS-DIPHTH-ACELL PERTUSSIS 5-2.5-18.5 LF-MCG/0.5 IM SUSP: 0.5000 mL | Freq: Once | INTRAMUSCULAR | Status: DC

## 2018-10-03 MED ORDER — SOD CITRATE-CITRIC ACID 500-334 MG/5ML PO SOLN: 30.0000 mL | ORAL | Status: DC | PRN

## 2018-10-03 MED ORDER — SIMETHICONE 80 MG PO CHEW: 80.0000 mg | CHEWABLE_TABLET | ORAL | Status: DC | PRN

## 2018-10-03 MED ORDER — PHENYLEPHRINE 40 MCG/ML (10ML) SYRINGE FOR IV PUSH (FOR BLOOD PRESSURE SUPPORT)
80.0000 ug | PREFILLED_SYRINGE | INTRAVENOUS | Status: DC | PRN
  Filled 2018-10-03: qty 10

## 2018-10-03 MED ORDER — DIPHENHYDRAMINE HCL 50 MG/ML IJ SOLN: 12.5000 mg | INTRAMUSCULAR | Status: DC | PRN

## 2018-10-03 MED ORDER — DIPHENHYDRAMINE HCL 25 MG PO CAPS: 25.0000 mg | ORAL_CAPSULE | Freq: Four times a day (QID) | ORAL | Status: DC | PRN

## 2018-10-03 MED ORDER — ACETAMINOPHEN 325 MG PO TABS: 650.0000 mg | ORAL_TABLET | ORAL | Status: DC | PRN

## 2018-10-03 MED ORDER — PRENATAL MULTIVITAMIN CH
1.0000 | ORAL_TABLET | Freq: Every day | ORAL | Status: DC
  Administered 2018-10-04 – 2018-10-05 (×2): 1 via ORAL
  Filled 2018-10-03 (×2): qty 1

## 2018-10-03 MED ORDER — SODIUM CHLORIDE (PF) 0.9 % IJ SOLN
INTRAMUSCULAR | Status: DC | PRN
  Administered 2018-10-03: 12 mL/h via EPIDURAL

## 2018-10-03 MED ORDER — OXYTOCIN BOLUS FROM INFUSION
500.0000 mL | Freq: Once | INTRAVENOUS | Status: AC
  Administered 2018-10-03: 500 mL via INTRAVENOUS

## 2018-10-03 MED ORDER — MISOPROSTOL 25 MCG QUARTER TABLET
25.0000 ug | ORAL_TABLET | ORAL | Status: DC | PRN
  Administered 2018-10-03 (×2): 25 ug via VAGINAL
  Filled 2018-10-03: qty 1

## 2018-10-03 MED ORDER — FENTANYL-BUPIVACAINE-NACL 0.5-0.125-0.9 MG/250ML-% EP SOLN
12.0000 mL/h | EPIDURAL | Status: DC | PRN
  Filled 2018-10-03: qty 250

## 2018-10-03 MED ORDER — SENNOSIDES-DOCUSATE SODIUM 8.6-50 MG PO TABS
2.0000 | ORAL_TABLET | ORAL | Status: DC
  Administered 2018-10-03 – 2018-10-04 (×2): 2 via ORAL
  Filled 2018-10-03 (×2): qty 2

## 2018-10-03 MED ORDER — FENTANYL CITRATE (PF) 100 MCG/2ML IJ SOLN
100.0000 ug | INTRAMUSCULAR | Status: DC | PRN
  Administered 2018-10-03 (×2): 100 ug via INTRAVENOUS
  Filled 2018-10-03 (×2): qty 2

## 2018-10-03 MED ORDER — WITCH HAZEL-GLYCERIN EX PADS: 1.0000 "application " | MEDICATED_PAD | CUTANEOUS | Status: DC | PRN

## 2018-10-03 MED ORDER — DIBUCAINE (PERIANAL) 1 % EX OINT: 1.0000 "application " | TOPICAL_OINTMENT | CUTANEOUS | Status: DC | PRN

## 2018-10-03 MED ORDER — ZOLPIDEM TARTRATE 5 MG PO TABS: 5.0000 mg | ORAL_TABLET | Freq: Every evening | ORAL | Status: DC | PRN

## 2018-10-03 MED ORDER — OXYTOCIN 40 UNITS IN NORMAL SALINE INFUSION - SIMPLE MED
2.5000 [IU]/h | INTRAVENOUS | Status: DC
  Administered 2018-10-03: 17:00:00 2.5 [IU]/h via INTRAVENOUS
  Filled 2018-10-03: qty 1000

## 2018-10-03 MED ORDER — EPHEDRINE 5 MG/ML INJ: 10.0000 mg | INTRAVENOUS | Status: DC | PRN

## 2018-10-03 MED ORDER — IBUPROFEN 600 MG PO TABS
600.0000 mg | ORAL_TABLET | Freq: Four times a day (QID) | ORAL | Status: DC
  Administered 2018-10-03 – 2018-10-05 (×8): 600 mg via ORAL
  Filled 2018-10-03 (×8): qty 1

## 2018-10-03 MED ORDER — LACTATED RINGERS IV SOLN: 500.0000 mL | INTRAVENOUS | Status: DC | PRN

## 2018-10-03 MED ORDER — COCONUT OIL OIL: 1.0000 "application " | TOPICAL_OIL | Status: DC | PRN

## 2018-10-03 MED ORDER — LIDOCAINE HCL (PF) 1 % IJ SOLN
INTRAMUSCULAR | Status: DC | PRN
  Administered 2018-10-03 (×2): 4 mL via EPIDURAL

## 2018-10-03 MED ORDER — LACTATED RINGERS IV SOLN: 500.0000 mL | Freq: Once | INTRAVENOUS | Status: DC

## 2018-10-03 MED ORDER — BENZOCAINE-MENTHOL 20-0.5 % EX AERO
1.0000 "application " | INHALATION_SPRAY | CUTANEOUS | Status: DC | PRN
  Administered 2018-10-03: 1 via TOPICAL
  Filled 2018-10-03: qty 56

## 2018-10-03 MED ORDER — MISOPROSTOL 25 MCG QUARTER TABLET
ORAL_TABLET | ORAL | Status: AC
  Filled 2018-10-03: qty 1

## 2018-10-03 MED ORDER — ONDANSETRON HCL 4 MG/2ML IJ SOLN: 4.0000 mg | Freq: Four times a day (QID) | INTRAMUSCULAR | Status: DC | PRN

## 2018-10-03 MED ORDER — LIDOCAINE HCL (PF) 1 % IJ SOLN: 30.0000 mL | INTRAMUSCULAR | Status: DC | PRN

## 2018-10-03 MED ORDER — ONDANSETRON HCL 4 MG PO TABS: 4.0000 mg | ORAL_TABLET | ORAL | Status: DC | PRN

## 2018-10-03 MED ORDER — OXYTOCIN 40 UNITS IN NORMAL SALINE INFUSION - SIMPLE MED
1.0000 m[IU]/min | INTRAVENOUS | Status: DC
  Administered 2018-10-03: 11:00:00 2 m[IU]/min via INTRAVENOUS

## 2018-10-03 NOTE — Anesthesia Preprocedure Evaluation (Addendum)
Anesthesia Evaluation  Patient identified by MRN, date of birth, ID band Patient awake    Reviewed: Allergy & Precautions, Patient's Chart, lab work & pertinent test results  History of Anesthesia Complications Negative for: history of anesthetic complications  Airway Mallampati: II  TM Distance: >3 FB Neck ROM: Full    Dental no notable dental hx.    Pulmonary neg pulmonary ROS,    Pulmonary exam normal        Cardiovascular hypertension, Normal cardiovascular exam     Neuro/Psych Anxiety Depression negative neurological ROS     GI/Hepatic Neg liver ROS, GERD  ,  Endo/Other  negative endocrine ROS  Renal/GU negative Renal ROS     Musculoskeletal negative musculoskeletal ROS (+)   Abdominal   Peds  Hematology negative hematology ROS (+)   Anesthesia Other Findings Day of surgery medications reviewed with the patient.  Reproductive/Obstetrics (+) Pregnancy (IOL for pre-eclampsia)                           Anesthesia Physical Anesthesia Plan  ASA: II  Anesthesia Plan: Epidural   Post-op Pain Management:    Induction:   PONV Risk Score and Plan: Treatment may vary due to age or medical condition  Airway Management Planned: Natural Airway  Additional Equipment:   Intra-op Plan:   Post-operative Plan:   Informed Consent: I have reviewed the patients History and Physical, chart, labs and discussed the procedure including the risks, benefits and alternatives for the proposed anesthesia with the patient or authorized representative who has indicated his/her understanding and acceptance.       Plan Discussed with:   Anesthesia Plan Comments:        Anesthesia Quick Evaluation

## 2018-10-03 NOTE — Progress Notes (Signed)
Informed Dr. Fransisco Beau of patient's platelet count 177, order to remove the epidural.

## 2018-10-03 NOTE — H&P (Signed)
Pam Pace is a 23 y.o. female presenting for IOL for chronic hypertension.  35 to G1P0 @ 39+0 presents for IOL for chronic hypertension without medications.  OB History    Gravida  1   Para  0   Term  0   Preterm  0   AB  0   Living  0     SAB  0   TAB  0   Ectopic  0   Multiple  0   Live Births             Past Medical History:  Diagnosis Date  . Anemia   . Anxiety   . Depression   . GERD (gastroesophageal reflux disease)   . Hormone disorder   . Hypertension   . Strep throat    History reviewed. No pertinent surgical history. Family History: family history includes Diabetes in her maternal grandmother and paternal grandmother; Thyroid disease (age of onset: 12) in her mother. Social History:  reports that she is a non-smoker but has been exposed to tobacco smoke. She has never used smokeless tobacco. She reports that she does not drink alcohol or use drugs.     Maternal Diabetes: No Genetic Screening: Declined Maternal Ultrasounds/Referrals: Normal Fetal Ultrasounds or other Referrals:  None Maternal Substance Abuse:  No Significant Maternal Medications:  None Significant Maternal Lab Results:  None Other Comments:  None  ROS History Dilation: (P) 10 Effacement (%): (P) 100 Station: (P) Plus 1 Exam by:: (P) katherine g. jones RN  Blood pressure 131/76, pulse (!) 120, temperature 98.2 F (36.8 C), temperature source Oral, resp. rate 20, height 5' 8.5" (1.74 m), weight 112.9 kg. Exam Physical Exam  Prenatal labs: ABO, Rh: --/--/O POS, O POS Performed at [facility], [place] [address], [place], [place] [address]  [phone]) Antibody: NEG (07/31 0036) Rubella: Immune (12/26 0000) RPR: Non Reactive (07/31 0036)  HBsAg: Negative (12/26 0000)  HIV: Non-reactive (12/26 0000)  GBS: Negative (07/17 0000)   Assessment/Plan: 1) Admit 2) Cytotech 75mcg PV Q 4 3) AROM when able 4) Epidural on request   [name] 10/03/2018, 4:47  PM

## 2018-10-03 NOTE — Anesthesia Procedure Notes (Signed)
Epidural Patient location during procedure: OB Start time: 10/03/2018 9:51 AM End time: 10/03/2018 9:55 AM  Staffing Anesthesiologist: Brennan Bailey, MD Performed: anesthesiologist   Preanesthetic Checklist Completed: patient identified, pre-op evaluation, timeout performed, IV checked, risks and benefits discussed and monitors and equipment checked  Epidural Patient position: sitting Prep: site prepped and draped and DuraPrep Patient monitoring: continuous pulse ox, blood pressure and heart rate Approach: midline Location: L3-L4 Injection technique: LOR air  Needle:  Needle type: Tuohy  Needle gauge: 17 G Needle length: 9 cm Needle insertion depth: 8 cm Catheter type: closed end flexible Catheter size: 19 Gauge Catheter at skin depth: 13 cm Test dose: negative and Other (1% lidocaine)  Assessment Events: blood not aspirated, injection not painful, no injection resistance, negative IV test and no paresthesia  Additional Notes Patient identified. Risks, benefits, and alternatives discussed with patient including but not limited to bleeding, infection, nerve damage, paralysis, failed block, incomplete pain control, headache, blood pressure changes, nausea, vomiting, reactions to medication, itching, and postpartum back pain. Confirmed with bedside nurse the patient's most recent platelet count. Confirmed with patient that they are not currently taking any anticoagulation, have any bleeding history, or any family history of bleeding disorders. Patient expressed understanding and wished to proceed. All questions were answered. Sterile technique was used throughout the entire procedure. Please see nursing notes for vital signs.   2 attempts at L3-4 required to access interspinous space. Test dose was given through epidural catheter and negative prior to continuing to dose epidural or start infusion. Warning signs of high block given to the patient including shortness of breath,  tingling/numbness in hands, complete motor block, or any concerning symptoms with instructions to call for help. Patient was given instructions on fall risk and not to get out of bed. All questions and concerns addressed with instructions to call with any issues or inadequate analgesia.  Reason for block:procedure for pain

## 2018-10-03 NOTE — Lactation Note (Signed)
This note was copied from a baby's chart. Lactation Consultation Note  Patient Name: Pam Pace [id number] Date: 10/03/2018 Reason for consult: 1st time breastfeeding;Initial assessment;Term P1, 5 hour female infant. Mom has DEBP at home and is not on the [facility] program. Infant had one void and one stool since delivery. Infant did not latch in L&D, this is infant's first time latching to breast. LC entered room, mom was breastfeeding infant on left breast using the  cradle hold, infant nose and chin was touching breast, infant was in rthymitic sucking pattern.  Nurse assisted with latch earlier, infant breastfeeding for 10 minutes when Stanley left the room. Mom knows to breastfeed infant according hunger cues, 8 to 12 times within 24 hours and on demand. Mom knows to call Nurse or Lane if she has any questions, concerns or need assistance with latching infant to breast.  LC discussed I & O. Mom made aware of O/P services, breastfeeding support groups, community resources, and our phone # for post-discharge questions.  Maternal Data Formula Feeding for Exclusion: No Has patient been taught Hand Expression?: Yes Does the patient have breastfeeding experience prior to this delivery?: No  Feeding Feeding Type: Breast Fed  LATCH Score Latch: Grasps breast easily, tongue down, lips flanged, rhythmical sucking.  Audible Swallowing: A few with stimulation  Type of Nipple: Everted at rest and after stimulation  Comfort (Breast/Nipple): Soft / non-tender  Hold (Positioning): Assistance needed to correctly position infant at breast and maintain latch.  LATCH Score: 8  Interventions Interventions: Breast feeding basics reviewed;Breast compression;Assisted with latch;Adjust position;Skin to skin;Support pillows;Breast massage;Position options;Hand express;Expressed milk  Lactation Tools Discussed/Used WIC Program: No   Consult Status Consult Status: Follow-up Date: 10/04/18 Follow-up type:  In-patient    Vicente Serene 10/03/2018, 9:34 PM

## 2018-10-03 NOTE — MAU Note (Signed)
Covid swab obtained without difficulty and pt tol well. No symptoms 

## 2018-10-04 LAB — CBC
HCT: 33.1 % — ABNORMAL LOW (ref 36.0–46.0)
Hemoglobin: 10.3 g/dL — ABNORMAL LOW (ref 12.0–15.0)
MCH: 26.3 pg (ref 26.0–34.0)
MCHC: 31.1 g/dL (ref 30.0–36.0)
MCV: 84.7 fL (ref 80.0–100.0)
Platelets: 174 10*3/uL (ref 150–400)
RBC: 3.91 MIL/uL (ref 3.87–5.11)
WBC: 15.1 10*3/uL — ABNORMAL HIGH (ref 4.0–10.5)
nRBC: 0 % (ref 0.0–0.2)

## 2018-10-04 NOTE — Anesthesia Postprocedure Evaluation (Signed)
Anesthesia Post Note  Patient: Pam Pace  Procedure(s) Performed: AN AD HOC LABOR EPIDURAL     Patient location during evaluation: Mother Baby Anesthesia Type: Epidural Level of consciousness: awake and alert and oriented Pain management: satisfactory to patient Vital Signs Assessment: post-procedure vital signs reviewed and stable Respiratory status: respiratory function stable Cardiovascular status: stable Postop Assessment: no headache, epidural receding, patient able to bend at knees, no signs of nausea or vomiting and adequate PO intake Anesthetic complications: no    Last Vitals:  Vitals:   10/04/18 0400 10/04/18 0630  BP: 130/82 126/80  Pulse: 90 85  Resp: 18 18  Temp: 36.7 C 36.8 C  SpO2: 97% 96%    Last Pain:  Vitals:   10/04/18 0630  TempSrc: Oral  PainSc: 0-No pain   Pain Goal:                   Pam Pace

## 2018-10-04 NOTE — Anesthesia Postprocedure Evaluation (Signed)
Anesthesia Post Note  Patient: Pam Pace  Procedure(s) Performed: AN AD HOC LABOR EPIDURAL     Patient location during evaluation: Mother Baby Anesthesia Type: Epidural Level of consciousness: awake and alert and oriented Pain management: satisfactory to patient Vital Signs Assessment: post-procedure vital signs reviewed and stable Respiratory status: respiratory function stable Cardiovascular status: stable Postop Assessment: no headache, no backache, epidural receding, patient able to bend at knees, no signs of nausea or vomiting and adequate PO intake Anesthetic complications: no    Last Vitals:  Vitals:   10/04/18 0400 10/04/18 0630  BP: 130/82 126/80  Pulse: 90 85  Resp: 18 18  Temp: 36.7 C 36.8 C  SpO2: 97% 96%    Last Pain:  Vitals:   10/04/18 0630  TempSrc: Oral  PainSc: 0-No pain   Pain Goal:                   Akshita Italiano

## 2018-10-04 NOTE — Lactation Note (Signed)
This note was copied from a baby's chart. Lactation Consultation Note  Patient Name: Boy Lacinda Curvin [id number] Date: 10/04/2018 Reason for consult: Follow-up assessment;Term;Primapara;1st time breastfeeding  1639 - 1652 - I followed up with Ms. Spurr today. She was attempting to latch her son, Ky Barban, upon entry. She states that he latches well, but at times, the latch is uncomfortable. It can also take her some time to latch him.   I offered to assist, and she consented. I first suggested that she remove his blankets and breast feed Hudson skin to skin. This immediately helped to wake him up. I then showed her how to hold her left breast in a U hold and how to position in cross cradle position. He opened wide around the nipple and shortly thereafter began to suckle. I noted good suckling sequences and jaw excursions. I showed her how to gently pester him to stay active on the breast.  Mom denied pain with latch. I showed her how to then shift her arm to cradle hold once he was rhythmic.  I spent some time educating Ms. Boeder on day 2 infant feeding pattens and recommended lots of skin to skin today and feeding on demand. I also recommended that dad assist with skin to skin (he was watching something, but he did verbalize understanding).  I showed Ms. Dumlao how to check Hudson's lower lip for flanging and how to safely remove baby from breast, if needed.  I stated that we would follow up tomorrow to discuss discharge education. As she is a P1, I discussed benefits of follow up OP support to increasing her confidence. I stated that we could follow up tomorrow regarding her interest in a follow up call from our OP consultant.  Ms. Ruddell seemed relaxed and comfortable with baby at the breast. I provided her encouragement and praise for her efforts.  Maternal Data Formula Feeding for Exclusion: No Has patient been taught Hand Expression?: Yes Does the patient have breastfeeding experience prior to  this delivery?: No  Feeding Feeding Type: Breast Fed  LATCH Score Latch: Grasps breast easily, tongue down, lips flanged, rhythmical sucking.  Audible Swallowing: A few with stimulation  Type of Nipple: Everted at rest and after stimulation  Comfort (Breast/Nipple): Soft / non-tender  Hold (Positioning): Assistance needed to correctly position infant at breast and maintain latch.  LATCH Score: 8  Interventions Interventions: Breast feeding basics reviewed;Assisted with latch;Skin to skin;Adjust position;Breast compression   Consult Status Consult Status: Follow-up Date: 10/05/18 Follow-up type: In-patient    Lenore Manner 10/04/2018, 4:57 PM

## 2018-10-04 NOTE — Progress Notes (Signed)
CSW received consult for hx of Anxiety.  CSW met with MOB to offer support and complete assessment.    CSW congratulated Mob and Fob on the birth of infant (Hudson). MOB was advised of the HIPPA policy and requested that FOB remain in the room. CSW advised MOB of the reason for the visit. MOB reports   that she was diagnosed with anxiety in 2016-2017. MOB reports that she was on mediation and in therapy at that time however no longer needing either. MOB reported that she  has been feeling fine and doing really well since giving birth. MOB confirmed that she has all needed items to care for infant with no further needs at this time.   CSW provided education regarding the baby blues period vs. perinatal mood disorders, discussed treatment and gave resources for mental health follow up if concerns arise.  CSW recommends self-evaluation during the postpartum time period using the New Mom Checklist from Postpartum Progress and encouraged MOB to contact a medical professional if symptoms are noted at any time.   CSW provided review of Sudden Infant Death Syndrome (SIDS) precautions.   CSW identifies no further need for intervention and no barriers to discharge at this time.    Rahmon Heigl S. Adelai Achey, MSW, LCSW [facility] [phone]   

## 2018-10-04 NOTE — Progress Notes (Signed)
Post Partum Day 1 Subjective: no complaints, up ad lib, voiding and tolerating PO  Objective: Blood pressure 123/78, pulse 85, temperature 97.9 F (36.6 C), temperature source Oral, resp. rate 18, height 5' 8.5" (1.74 m), weight 112.9 kg, SpO2 96 %, unknown if currently breastfeeding.  Physical Exam:  General: alert, cooperative and appears stated age Lochia: appropriate Uterine Fundus: firm Incision: healing well DVT Evaluation: No evidence of DVT seen on physical exam.  Recent Labs    10/03/18 1851 10/04/18 0336  HGB 11.2* 10.3*  HCT 35.6* 33.1*    Assessment/Plan: Plan for discharge tomorrow  Desires neonatal circumcision, R/B/A of procedure discussed at length. Pt understands that neonatal circumcision is not considered medically necessary and is elective. The risks include, but are not limited to bleeding, infection, damage to the penis, development of scar tissue, and having to have it redone at a later date. Pt understands theses risks and wishes to proceed    LOS: 1 day   [name] 10/04/2018, 11:49 AM

## 2018-10-05 MED ORDER — IBUPROFEN 600 MG PO TABS: 600.0000 mg | ORAL_TABLET | Freq: Four times a day (QID) | ORAL | 0 refills | Status: AC | PRN

## 2018-10-05 NOTE — Discharge Summary (Signed)
Obstetric Discharge Summary Reason for Admission: induction of labor Prenatal Procedures: NST and ultrasound Intrapartum Procedures: spontaneous vaginal delivery Postpartum Procedures: none Complications-Operative and Postpartum: 2nd degree perineal laceration Hemoglobin  Date Value Ref Range Status  10/04/2018 10.3 (L) 12.0 - 15.0 g/dL Final   HCT  Date Value Ref Range Status  10/04/2018 33.1 (L) 36.0 - 46.0 % Final    Physical Exam:  General: alert, cooperative and appears stated age 23: appropriate Uterine Fundus: firm Incision: healing well DVT Evaluation: No evidence of DVT seen on physical exam.  Discharge Diagnoses: Term Pregnancy-delivered  Discharge Information: Date: 10/05/2018 Activity: pelvic rest Diet: routine Medications: Ibuprofen Condition: improved Instructions: refer to practice specific booklet Discharge to: home Follow-up Information    Pam Kick, MD Follow up in 4 week(s).   Specialty: Obstetrics and Gynecology Why: For a postpartum evaluation Contact information: [address] [place] [address] [phone]           Newborn Data: Live born female  Birth Weight: 7 lb 13.9 oz (3569 g) APGAR: 83, 9  Newborn Delivery   Birth date/time: 10/03/2018 16:19:00 Delivery type: Vaginal, Spontaneous      Home with mother.  Pam Pace 10/05/2018, 11:20 AM

## 2018-10-05 NOTE — Lactation Note (Signed)
This note was copied from a baby's chart. Lactation Consultation Note  Patient Name: Pam Pace [id number] Date: 10/05/2018 Reason for consult: Follow-up assessment;Term;Primapara;1st time breastfeeding  P1 mother whose infant is now 68 hours old.    Mother was finishing breast feeding when I arrived.  She had baby swaddled and I suggested that she may want to feed him STS with the next feed to obtain a deeper latch and help increase milk supply.  Mother verbalized understanding.  Encouraged to continue feeding 8-12 times/24 hours or sooner if he shows cues.  Mother stated that baby cluster fed last night.  Engorgement prevention/treatment reviewed.  Manual pump with instructions for use provided.    Mother had a few questions which I answered to her satisfaction.  She has a DEBP for home use and our OP phone number for questions after discharge.  Father present.   Maternal Data Formula Feeding for Exclusion: No Has patient been taught Hand Expression?: Yes Does the patient have breastfeeding experience prior to this delivery?: No  Feeding Feeding Type: Breast Fed  LATCH Score                   Interventions    Lactation Tools Discussed/Used Pump Review: Setup, frequency, and cleaning Initiated by:: Issaic Welliver Date initiated:: 10/05/18   Consult Status Consult Status: Complete Date: 10/05/18 Follow-up type: Call as needed    Lauraine Crespo R Birtha Hatler 10/05/2018, 9:15 AM
